# Patient Record
Sex: Male | Born: 1985 | Race: Black or African American | Hispanic: No | Marital: Single | Smoking: Never smoker
Health system: Southern US, Community
[De-identification: ages and names within clinical notes are randomized; demographics above are authoritative.]

---

## 2007-11-29 ENCOUNTER — Emergency Department (HOSPITAL_COMMUNITY): Admission: EM | Admit: 2007-11-29 | Discharge: 2007-11-30 | Payer: Self-pay | Admitting: Emergency Medicine

## 2013-10-16 ENCOUNTER — Other Ambulatory Visit: Payer: Self-pay | Admitting: Thoracic Surgery (Cardiothoracic Vascular Surgery)

## 2013-10-16 ENCOUNTER — Emergency Department (HOSPITAL_COMMUNITY)
Admission: EM | Admit: 2013-10-16 | Discharge: 2013-10-16 | Disposition: A | Discharge status: Home / Self Care | Payer: Self-pay | Attending: Emergency Medicine | Admitting: Emergency Medicine

## 2013-10-16 ENCOUNTER — Encounter (HOSPITAL_COMMUNITY): Payer: Self-pay | Admitting: Emergency Medicine

## 2013-10-16 ENCOUNTER — Emergency Department (HOSPITAL_COMMUNITY): Payer: Self-pay

## 2013-10-16 DIAGNOSIS — S6990XA Unspecified injury of unspecified wrist, hand and finger(s), initial encounter: Secondary | ICD-10-CM

## 2013-10-16 DIAGNOSIS — J9383 Other pneumothorax: Secondary | ICD-10-CM | POA: Insufficient documentation

## 2013-10-16 DIAGNOSIS — J939 Pneumothorax, unspecified: Secondary | ICD-10-CM

## 2013-10-16 DIAGNOSIS — Y9389 Activity, other specified: Secondary | ICD-10-CM | POA: Insufficient documentation

## 2013-10-16 DIAGNOSIS — S62101A Fracture of unspecified carpal bone, right wrist, initial encounter for closed fracture: Secondary | ICD-10-CM

## 2013-10-16 DIAGNOSIS — S62109A Fracture of unspecified carpal bone, unspecified wrist, initial encounter for closed fracture: Secondary | ICD-10-CM | POA: Insufficient documentation

## 2013-10-16 DIAGNOSIS — W108XXA Fall (on) (from) other stairs and steps, initial encounter: Secondary | ICD-10-CM | POA: Insufficient documentation

## 2013-10-16 DIAGNOSIS — S59909A Unspecified injury of unspecified elbow, initial encounter: Secondary | ICD-10-CM | POA: Insufficient documentation

## 2013-10-16 DIAGNOSIS — S2249XA Multiple fractures of ribs, unspecified side, initial encounter for closed fracture: Secondary | ICD-10-CM | POA: Insufficient documentation

## 2013-10-16 DIAGNOSIS — S2241XA Multiple fractures of ribs, right side, initial encounter for closed fracture: Secondary | ICD-10-CM

## 2013-10-16 DIAGNOSIS — W1809XA Striking against other object with subsequent fall, initial encounter: Secondary | ICD-10-CM | POA: Insufficient documentation

## 2013-10-16 DIAGNOSIS — S59919A Unspecified injury of unspecified forearm, initial encounter: Secondary | ICD-10-CM

## 2013-10-16 DIAGNOSIS — Y9289 Other specified places as the place of occurrence of the external cause: Secondary | ICD-10-CM | POA: Insufficient documentation

## 2013-10-16 LAB — BASIC METABOLIC PANEL
Anion gap: 18 — ABNORMAL HIGH (ref 5–15)
BUN: 9 mg/dL (ref 6–23)
CHLORIDE: 98 meq/L (ref 96–112)
CO2: 23 mEq/L (ref 19–32)
CREATININE: 0.74 mg/dL (ref 0.50–1.35)
Calcium: 9.7 mg/dL (ref 8.4–10.5)
GFR calc non Af Amer: >90 mL/min (ref >90)
Glucose, Bld: 97 mg/dL (ref 70–99)
Potassium: 3.7 mEq/L (ref 3.7–5.3)
Sodium: 139 mEq/L (ref 137–147)

## 2013-10-16 LAB — CBC
HCT: 43.6 % (ref 39.0–52.0)
HEMOGLOBIN: 16 g/dL (ref 13.0–17.0)
MCH: 34.9 pg — AB (ref 26.0–34.0)
MCHC: 36.7 g/dL — AB (ref 30.0–36.0)
MCV: 95.2 fL (ref 78.0–100.0)
Platelets: 210 10*3/uL (ref 150–400)
RBC: 4.58 MIL/uL (ref 4.22–5.81)
RDW: 13.9 % (ref 11.5–15.5)
WBC: 13.5 10*3/uL — ABNORMAL HIGH (ref 4.0–10.5)

## 2013-10-16 MED ORDER — KETOROLAC TROMETHAMINE 30 MG/ML IJ SOLN
30.0000 mg | Freq: Once | INTRAMUSCULAR | Status: AC
  Administered 2013-10-16: 30 mg via INTRAVENOUS
  Filled 2013-10-16: qty 1

## 2013-10-16 MED ORDER — NAPROXEN 500 MG PO TABS: 500.0000 mg | ORAL_TABLET | Freq: Two times a day (BID) | ORAL | Status: AC

## 2013-10-16 MED ORDER — MORPHINE SULFATE 4 MG/ML IJ SOLN
4.0000 mg | Freq: Once | INTRAMUSCULAR | Status: AC
  Administered 2013-10-16: 4 mg via INTRAVENOUS
  Filled 2013-10-16: qty 1

## 2013-10-16 MED ORDER — MORPHINE SULFATE 4 MG/ML IJ SOLN: 4.0000 mg | INTRAMUSCULAR | Status: DC | PRN

## 2013-10-16 MED ORDER — HYDROCODONE-ACETAMINOPHEN 5-325 MG PO TABS: 1.0000 | ORAL_TABLET | Freq: Four times a day (QID) | ORAL | Status: DC | PRN

## 2013-10-16 NOTE — ED Notes (Signed)
Pt given specific details on x-ray and cardiothoracic appt tomorrow. Acknowledges understanding of appt details and what symptoms to return to ED for. No other questions/concersns. Resp even/unlabored. Speaking full/clear sentences. SpO2 100% on RA. AVS explained in detail.

## 2013-10-16 NOTE — ED Notes (Addendum)
Pt sts rib pain increases w/ deep breathing and movement.  Bruising noted to R side posterior ribcage.  C/o R wrist pain.  Swelling noted to wrist.  Pt reports take 500mg  Tylenol x 2 w/o relief.
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
## 2013-10-16 NOTE — Discharge Instructions (Signed)
Please seek medical care immediately if you start experiencing a sudden increase in shortness of breath, pain in your chest, fast heartbeat or persistent cough. Do you exert yourself over the next 24 hours, do not smoke. Use your incentive spirometer and take your naproxen pills in order to optimize your breathing. If pain gets worse, you may also use the narco  Then, make sure to attend your xray and cardiothoracic surgery appointment tomorrow.   Pneumothorax A pneumothorax, commonly called a collapsed lung, is a condition in which air leaks from a lung and builds up in the space between the lung and the chest wall (pleural space). The air in a pneumothorax is trapped outside the lung and takes up space, preventing the lung from fully expanding. This is a condition that usually occurs suddenly. The buildup of air may be small or large. A small pneumothorax may go away on its own. When a pneumothorax is larger, it will often require medical treatment and hospitalization.  CAUSES  A pneumothorax can sometimes happen quickly with no apparent cause. People with underlying lung problems, particularly COPD or emphysema, are at higher risk of pneumothorax. However, pneumothorax can happen quickly even in people with no prior known lung problems. Trauma, surgery, medical procedures, or injury to the chest wall can also cause a pneumothorax. SIGNS AND SYMPTOMS  Sometimes a pneumothorax will have no symptoms. When symptoms are present, they can include:  Chest pain.  Shortness of breath.  Increased rate of breathing.  Bluish color to your lips or skin (cyanosis). DIAGNOSIS  Pneumothorax is usually diagnosed by a chest X-ray or chest CT scan. Your health care provider will also take a medical history and perform a physical exam to determine why you may have a pneumothorax. TREATMENT  A small pneumothorax may go away on its own without treatment. Extra oxygen can sometimes help a small pneumothorax go  away more quickly. For a larger pneumothorax or a pneumothorax that is causing symptoms, a procedure is usually needed to drain the air.In some cases, the health care provider may drain the air using a needle. In other cases, a chest tube may be inserted into the pleural space. A chest tube is a small tube placed between the ribs and into the pleural space. This removes the extra air and allows the lung to expand back to its normal size. A large pneumothorax will usually require a hospital stay. If there is ongoing air leakage into the pleural space, then the chest tube may need to remain in place for several days until the air leak has healed. In some cases, surgery may be needed.  HOME CARE INSTRUCTIONS   Only take over-the-counter or prescription medicines as directed by your health care provider.  If a cough or pain makes it difficult for you to sleep at night, try sleeping in a semi-upright position in a recliner or by using 2 or 3 pillows.  Rest and limit activity as directed by your health care provider.  If you had a chest tube and it was removed, ask your health care provider when it is okay to remove the dressing. Until your health care provider says you can remove the dressing, do not allow it to get wet.  Do not smoke. Smoking is a risk factor for pneumothorax.  Do not fly in an airplane or scuba dive until your health care provider says it is okay.  Follow up with your health care provider as directed. SEEK IMMEDIATE MEDICAL CARE IF:  You have increasing chest pain or shortness of breath.  You have a cough that is not controlled with suppressants.  You begin coughing up blood.  You have pain that is getting worse or is not controlled with medicines.  You cough up thick, discolored mucus (sputum) that is yellow to green in color.  You have redness, increasing pain, or discharge at the site where a chest tube had been in place (if your pneumothorax was treated with a chest  tube).  The site where your chest tube was located opens up.  You feel air coming out of the site where the chest tube was placed.  You have a fever or persistent symptoms for more than 2-3 days.  You have a fever and your symptoms suddenly get worse. MAKE SURE YOU:   Understand these instructions.  Will watch your condition.  Will get help right away if you are not doing well or get worse. Document Released: 03/02/2005 Document Revised: 12/21/2012 Document Reviewed: 09/29/2012 [facility] Patient Information 2015 [place], [place]. This information is not intended to replace advice given to you by your health care provider. Make sure you discuss any questions you have with your health care provider.   Rib Fracture A rib fracture is a break or crack in one of the bones of the ribs. The ribs are a group of long, curved bones that wrap around your chest and attach to your spine. They protect your lungs and other organs in the chest cavity. A broken or cracked rib is often painful, but most do not cause other problems. Most rib fractures heal on their own over time. However, rib fractures can be more serious if multiple ribs are broken or if broken ribs move out of place and push against other structures. CAUSES   A direct blow to the chest. For example, this could happen during contact sports, a car accident, or a fall against a hard object.  Repetitive movements with high force, such as pitching a baseball or having severe coughing spells. SYMPTOMS   Pain when you breathe in or cough.  Pain when someone presses on the injured area. DIAGNOSIS  Your caregiver will perform a physical exam. Various imaging tests may be ordered to confirm the diagnosis and to look for related injuries. These tests may include a chest X-ray, computed tomography (CT), magnetic resonance imaging (MRI), or a bone scan. TREATMENT  Rib fractures usually heal on their own in 1-3 months. The longer healing period is  often associated with a continued cough or other aggravating activities. During the healing period, pain control is very important. Medication is usually given to control pain. Hospitalization or surgery may be needed for more severe injuries, such as those in which multiple ribs are broken or the ribs have moved out of place.  HOME CARE INSTRUCTIONS   Avoid strenuous activity and any activities or movements that cause pain. Be careful during activities and avoid bumping the injured rib.  Gradually increase activity as directed by your caregiver.  Only take over-the-counter or prescription medications as directed by your caregiver. Do not take other medications without asking your caregiver first.  Apply ice to the injured area for the first 1-2 days after you have been treated or as directed by your caregiver. Applying ice helps to reduce inflammation and pain.  Put ice in a plastic bag.  Place a towel between your skin and the bag.   Leave the ice on for 15-20 minutes at a time, every  2 hours while you are awake.  Perform deep breathing as directed by your caregiver. This will help prevent pneumonia, which is a common complication of a broken rib. Your caregiver may instruct you to:  Take deep breaths several times a day.  Try to cough several times a day, holding a pillow against the injured area.  Use a device called an incentive spirometer to practice deep breathing several times a day.  Drink enough fluids to keep your urine clear or pale yellow. This will help you avoid constipation.   Do not wear a rib belt or binder. These restrict breathing, which can lead to pneumonia.  SEEK IMMEDIATE MEDICAL CARE IF:   You have a fever.   You have difficulty breathing or shortness of breath.   You develop a continual cough, or you cough up thick or bloody sputum.  You feel sick to your stomach (nausea), throw up (vomit), or have abdominal pain.   You have worsening pain not  controlled with medications.  MAKE SURE YOU:  Understand these instructions.  Will watch your condition.  Will get help right away if you are not doing well or get worse. Document Released: 03/02/2005 Document Revised: 11/02/2012 Document Reviewed: 05/04/2012 [facility] Patient Information 2015 [place], [place]. This information is not intended to replace advice given to you by your health care provider. Make sure you discuss any questions you have with your health care provider.    Wrist Fracture A wrist fracture is a break or crack in one of the bones of your wrist. Your wrist is made up of eight small bones at the palm of your hand (carpal bones) and two long bones that make up your forearm (radius and ulna).  CAUSES   A direct blow to the wrist.  Falling on an outstretched hand.  Trauma, such as a car accident or a fall. RISK FACTORS Risk factors for wrist fracture include:   Participating in contact and high-risk sports, such as skiing, biking, and ice skating.  Taking steroid medicines.  Smoking.  Being male.  Being Caucasian.  Drinking more than three alcoholic beverages per day.  Having low or lowered bone density (osteoporosis or osteopenia).  Age. Older adults have decreased bone density.  Women who have had menopause.  History of previous fractures. SIGNS AND SYMPTOMS Symptoms of wrist fractures include tenderness, bruising, and inflammation. Additionally, the wrist may hang in an odd position or appear deformed.  DIAGNOSIS Diagnosis may include:  Physical exam.  X-ray. TREATMENT Treatment depends on many factors, including the nature and location of the fracture, your age, and your activity level. Treatment for wrist fracture can be nonsurgical or surgical.  Nonsurgical Treatment A plaster cast or splint may be applied to your wrist if the bone is in a good position. If the fracture is not in good position, it may be necessary for your health care  provider to realign it before applying a splint or cast. Usually, a cast or splint will be worn for several weeks.  Surgical Treatment Sometimes the position of the bone is so far out of place that surgery is required to apply a device to hold it together as it heals. Depending on the fracture, there are a number of options for holding the bone in place while it heals, such as a cast and metal pins.  HOME CARE INSTRUCTIONS  Keep your injured wrist elevated and move your fingers as much as possible.  Do not put pressure on any part of  your cast or splint. It may break.   Use a plastic bag to protect your cast or splint from water while bathing or showering. Do not lower your cast or splint into water.  Take medicines only as directed by your health care provider.  Keep your cast or splint clean and dry. If it becomes wet, damaged, or suddenly feels too tight, contact your health care provider right away.  Do not use any tobacco products including cigarettes, chewing tobacco, or electronic cigarettes. Tobacco can delay bone healing. If you need help quitting, ask your health care provider.  Keep all follow-up visits as directed by your health care provider. This is important.  Ask your health care provider if you should take supplements of calcium and vitamins C and D to promote bone healing. SEEK MEDICAL CARE IF:   Your cast or splint is damaged, breaks, or gets wet.  You have a fever.  You have chills.  You have continued severe pain or more swelling than you did before the cast was put on. SEEK IMMEDIATE MEDICAL CARE IF:   Your hand or fingernails on the injured arm turn blue or gray, or feel cold or numb.  You have decreased feeling in the fingers of your injured arm. MAKE SURE YOU:  Understand these instructions.  Will watch your condition.  Will get help right away if you are not doing well or get worse. Document Released: 12/10/2004 Document Revised: 07/17/2013  Document Reviewed: 03/20/2011 [facility] Patient Information 2015 [place], [place]. This information is not intended to replace advice given to you by your health care provider. Make sure you discuss any questions you have with your health care provider.

## 2013-10-16 NOTE — ED Notes (Signed)
Pt made aware of PRN pain medication.  Pt is refusing at this time.

## 2013-10-16 NOTE — ED Notes (Signed)
MD at bedside. 

## 2013-10-17 ENCOUNTER — Other Ambulatory Visit: Payer: Self-pay | Admitting: *Deleted

## 2013-10-17 ENCOUNTER — Encounter: Payer: Self-pay | Admitting: Thoracic Surgery (Cardiothoracic Vascular Surgery)

## 2013-10-17 DIAGNOSIS — J9383 Other pneumothorax: Secondary | ICD-10-CM

## 2013-10-17 NOTE — ED Provider Notes (Signed)
Medical screening examination/treatment/procedure(s) were conducted as a shared visit with resident-physician practitioner(s) and myself.  I personally evaluated the patient during the encounter.  Pt is a 28 y.o. male with pmhx as above presenting with R lateral chest wall pain and R wrist pain after mechanical fall down steps yesterday.  Pt found to have rib fx of R 9, 10, 11 as well as R sided ptx w/o signs of tension. He has pain w/ inspiration, but no overt resp distress, SOB or O2 requirement. He has declined narcotics. R wrist w/ nondisplaced distal radius fx. Will place in radial gutter splint. Spoke w/ CT surgery RE PTX. Giving timing of injury and reassuring PE, D.r Tyrone SageGerhardt has offered to f/u with the pt in the office tomorrow for repeat exam and repeat CXR. Pt is in agreement of plan and also agrees to return for new or worsening symptoms.  Incentive spirometer given.   1. Pneumothorax on right   2. Wrist fracture, right, closed, initial encounter   3. Ribs, multiple fractures, right, closed, initial encounter       EKG Interpretation  Date/Time:    Ventricular Rate:    PR Interval:    QRS Duration:   QT Interval:    QTC Calculation:   R Axis:     Text Interpretation:          Shanna CiscoMegan E Glenn Christo, MD 10/17/13 1344

## 2014-04-18 ENCOUNTER — Encounter (HOSPITAL_COMMUNITY): Payer: Self-pay | Admitting: Emergency Medicine

## 2014-04-18 ENCOUNTER — Emergency Department (HOSPITAL_COMMUNITY)
Admission: EM | Admit: 2014-04-18 | Discharge: 2014-04-18 | Disposition: A | Discharge status: Home / Self Care | Payer: Worker's Compensation | Attending: Emergency Medicine | Admitting: Emergency Medicine

## 2014-04-18 ENCOUNTER — Emergency Department (HOSPITAL_COMMUNITY): Payer: Worker's Compensation

## 2014-04-18 DIAGNOSIS — S61307A Unspecified open wound of left little finger with damage to nail, initial encounter: Secondary | ICD-10-CM | POA: Diagnosis not present

## 2014-04-18 DIAGNOSIS — S61309A Unspecified open wound of unspecified finger with damage to nail, initial encounter: Secondary | ICD-10-CM

## 2014-04-18 DIAGNOSIS — S6992XA Unspecified injury of left wrist, hand and finger(s), initial encounter: Secondary | ICD-10-CM | POA: Diagnosis present

## 2014-04-18 DIAGNOSIS — Y990 Civilian activity done for income or pay: Secondary | ICD-10-CM | POA: Insufficient documentation

## 2014-04-18 DIAGNOSIS — Y9389 Activity, other specified: Secondary | ICD-10-CM | POA: Diagnosis not present

## 2014-04-18 DIAGNOSIS — S62632B Displaced fracture of distal phalanx of right middle finger, initial encounter for open fracture: Secondary | ICD-10-CM | POA: Diagnosis not present

## 2014-04-18 DIAGNOSIS — W228XXA Striking against or struck by other objects, initial encounter: Secondary | ICD-10-CM | POA: Insufficient documentation

## 2014-04-18 DIAGNOSIS — Y9289 Other specified places as the place of occurrence of the external cause: Secondary | ICD-10-CM | POA: Insufficient documentation

## 2014-04-18 DIAGNOSIS — S62609B Fracture of unspecified phalanx of unspecified finger, initial encounter for open fracture: Secondary | ICD-10-CM

## 2014-04-18 DIAGNOSIS — Z791 Long term (current) use of non-steroidal anti-inflammatories (NSAID): Secondary | ICD-10-CM | POA: Diagnosis not present

## 2014-04-18 MED ORDER — CEPHALEXIN 500 MG PO CAPS: 500.0000 mg | ORAL_CAPSULE | Freq: Four times a day (QID) | ORAL | Status: AC

## 2014-04-18 MED ORDER — HYDROCODONE-ACETAMINOPHEN 5-325 MG PO TABS
2.0000 | ORAL_TABLET | Freq: Once | ORAL | Status: AC
  Administered 2014-04-18: 2 via ORAL
  Filled 2014-04-18: qty 2

## 2014-04-18 MED ORDER — CEPHALEXIN 500 MG PO CAPS
500.0000 mg | ORAL_CAPSULE | Freq: Once | ORAL | Status: AC
  Administered 2014-04-18: 500 mg via ORAL
  Filled 2014-04-18: qty 1

## 2014-04-18 MED ORDER — LIDOCAINE HCL 2 % IJ SOLN
10.0000 mL | Freq: Once | INTRAMUSCULAR | Status: AC
  Administered 2014-04-18: 200 mg
  Filled 2014-04-18: qty 20

## 2014-04-18 MED ORDER — HYDROCODONE-ACETAMINOPHEN 5-325 MG PO TABS: 1.0000 | ORAL_TABLET | ORAL | Status: AC | PRN

## 2014-04-18 NOTE — Discharge Instructions (Signed)
Recheck with Hydrographic surveyorhand surgeon. We have given you the name of our on call hand surgeon. When you see your occupational health Dr. Bea Lauraomorrow they may choose to re-evaluate your finger, or refer you to a hand surgeon of their choice. Keep dressing on until re-evaluated (no more than 72 hours).  Finger Fracture Fractures of fingers are breaks in the bones of the fingers. There are many types of fractures. There are different ways of treating these fractures. Your health care provider will discuss the best way to treat your fracture. CAUSES Traumatic injury is the main cause of broken fingers. These include:  Injuries while playing sports.  Workplace injuries.  Falls. RISK FACTORS Activities that can increase your risk of finger fractures include:  Sports.  Workplace activities that involve machinery.  A condition called osteoporosis, which can make your bones less dense and cause them to fracture more easily. SIGNS AND SYMPTOMS The main symptoms of a broken finger are pain and swelling within 15 minutes after the injury. Other symptoms include:  Bruising of your finger.  Stiffness of your finger.  Numbness of your finger.  Exposed bones (compound fracture) if the fracture is severe. DIAGNOSIS  The best way to diagnose a broken bone is with X-ray imaging. Additionally, your health care provider will use this X-ray image to evaluate the position of the broken finger bones.  TREATMENT  Finger fractures can be treated with:   Nonreduction--This means the bones are in place. The finger is splinted without changing the positions of the bone pieces. The splint is usually left on for about a week to 10 days. This will depend on your fracture and what your health care provider thinks.  Closed reduction--The bones are put back into position without using surgery. The finger is then splinted.  Open reduction and internal fixation--The fracture site is opened. Then the bone pieces are fixed into  place with pins or some type of hardware. This is seldom required. It depends on the severity of the fracture. HOME CARE INSTRUCTIONS   Follow your health care provider's instructions regarding activities, exercises, and physical therapy.  Only take over-the-counter or prescription medicines for pain, discomfort, or fever as directed by your health care provider. SEEK MEDICAL CARE IF: You have pain or swelling that limits the motion or use of your fingers. SEEK IMMEDIATE MEDICAL CARE IF:  Your finger becomes numb. MAKE SURE YOU:   Understand these instructions.  Will watch your condition.  Will get help right away if you are not doing well or get worse. Document Released: 06/14/2000 Document Revised: 12/21/2012 Document Reviewed: 10/12/2012 [facility] Patient Information 2015 [place], [place]. This information is not intended to replace advice given to you by your health care provider. Make sure you discuss any questions you have with your health care provider.

## 2014-04-18 NOTE — ED Notes (Signed)
Pt states he smashed his little finger on his left hand at work tonight  Bleeding controlled

## 2014-04-18 NOTE — ED Provider Notes (Addendum)
CSN: [id number]     Arrival date & time 04/18/14  0206 History   First MD Initiated Contact with Patient 04/18/14 0222     Chief Complaint  Patient presents with  . Finger Injury      HPI  Patient presents with evaluation of the left small finger injury. He was at work [employer]. He was not wearing gloves. He was holding a large metal pole. He struck the pole against a stationary part of a heavy metal cart. Had a crush injury with injury to his distal phalanx of his left small finger with nail injury.  History reviewed. No pertinent past medical history. History reviewed. No pertinent past surgical history. History reviewed. No pertinent family history. History  Substance Use Topics  . Smoking status: Never Smoker   . Smokeless tobacco: Not on file  . Alcohol Use: Yes     Comment: occ    Review of Systems  Musculoskeletal:       Isolated left finger injury. No additional areas of pain or concern.      Allergies  Review of patient's allergies indicates no known allergies.  Home Medications   Prior to Admission medications   Medication Sig Start Date End Date Taking? Authorizing Provider  acetaminophen (TYLENOL) 500 MG tablet Take 1,000 mg by mouth every 6 (six) hours as needed for mild pain or headache.    Historical Provider, MD  cephALEXin (KEFLEX) 500 MG capsule Take 1 capsule (500 mg total) by mouth 4 (four) times daily. 04/18/14   Rolland PorterMark Niala Stcharles, MD  HYDROcodone-acetaminophen (NORCO/VICODIN) 5-325 MG per tablet Take 1 tablet by mouth every 4 (four) hours as needed. 04/18/14   Rolland PorterMark Coree Riester, MD  naproxen (NAPROSYN) 500 MG tablet Take 1 tablet (500 mg total) by mouth 2 (two) times daily with a meal. 10/16/13   Dionne AnoJulia Mallory, MD   BP 122/77 mmHg  Pulse 82  Temp(Src) 98.4 F (36.9 C) (Oral)  Resp 16  SpO2 97% Physical Exam  Musculoskeletal:       Hands:   ED Course  Procedures (including critical care time) Labs Review Labs Reviewed - No data to display  Imaging Review Dg  Finger Little Left  04/18/2014   CLINICAL DATA:  Left fifth digit pain after injury. Smashed finger between two objects.  EXAM: LEFT LITTLE FINGER 2+V  COMPARISON:  None.  FINDINGS: There is a mildly displaced fracture of the little finger distal tuft with dorsal displacement distal fracture right. Associated soft tissue irregularity is seen. There are no radiopaque foreign bodies. No additional fracture.  IMPRESSION: Mildly displaced distal tuft fracture of the little finger. Associated soft tissue irregularity is seen.   Electronically Signed   By: Rubye OaksMelanie  Ehinger M.D.   On: 04/18/2014 02:50     EKG Interpretation None       LACERATION REPAIR Performed by: Claudean KindsJAMES, Anetta Olvera JOSEPH Authorized by: Claudean KindsJAMES, Jahvon Gosline JOSEPH Consent: Verbal consent obtained. Risks and benefits: risks, benefits and alternatives were discussed Consent given by: patient Patient identity confirmed: provided demographic data Prepped and Draped in normal sterile fashion Wound explored  Laceration Location: Right 5th digit P3 and Nail bed  Laceration Length: 2cm  No Foreign Bodies seen or palpated  Anesthesia: local infiltration  Local anesthetic: lidocaine 2% without epinephrine digital block  Anesthetic total: 4 ml  Irrigation method: syringe Amount of cleaning: standard  Nail was incompletely avulsed. Proximal one third of the radial lateral elbow still had to the nailbed. This was removed. The nailbed was  then repaired. 6 sutures placed of the nailbed, 5-0 Vicryl.  Skin closure: 5-0 Prolene  Number of sutures: 4  Technique: simple  Patient tolerance: Patient tolerated the procedure well with no immediate complications.  MDM   Final diagnoses:  Fingernail avulsion, initial encounter  Open fracture of finger, initial encounter    Digit was repaired. There is a longitudinal laceration to the nailbed. The nail is removed nailbed was repaired with 5-0 Vicryl. Adjacent tissues were repaired with 5-0  proline. Nail was then sutured back into the eponychial fold and overlying the no laceration. Wound was then addressed. Patient's supervisor from work is here with him. He states that [employer] has a occupational health clinic tomorrow. He'll be reevaluated there. Given information regarding our hand surgeon on call. I would like for this injury to be  looked at again between 48- 72 hours. Ultimate f/u with a hand surgeon in 7-10 days.    Rolland Porter, MD 04/18/14 1191  Rolland Porter, MD 04/18/14 [phone]

## 2016-07-28 IMAGING — CR DG FINGER LITTLE 2+V*L*
3 series · 3 of 3 positions shown · non-contrast
Comparison: None.

CLINICAL DATA: Left fifth digit pain after injury. Smashed finger
between two objects.

EXAM:
LEFT LITTLE FINGER 2+V

[x finger pa left]
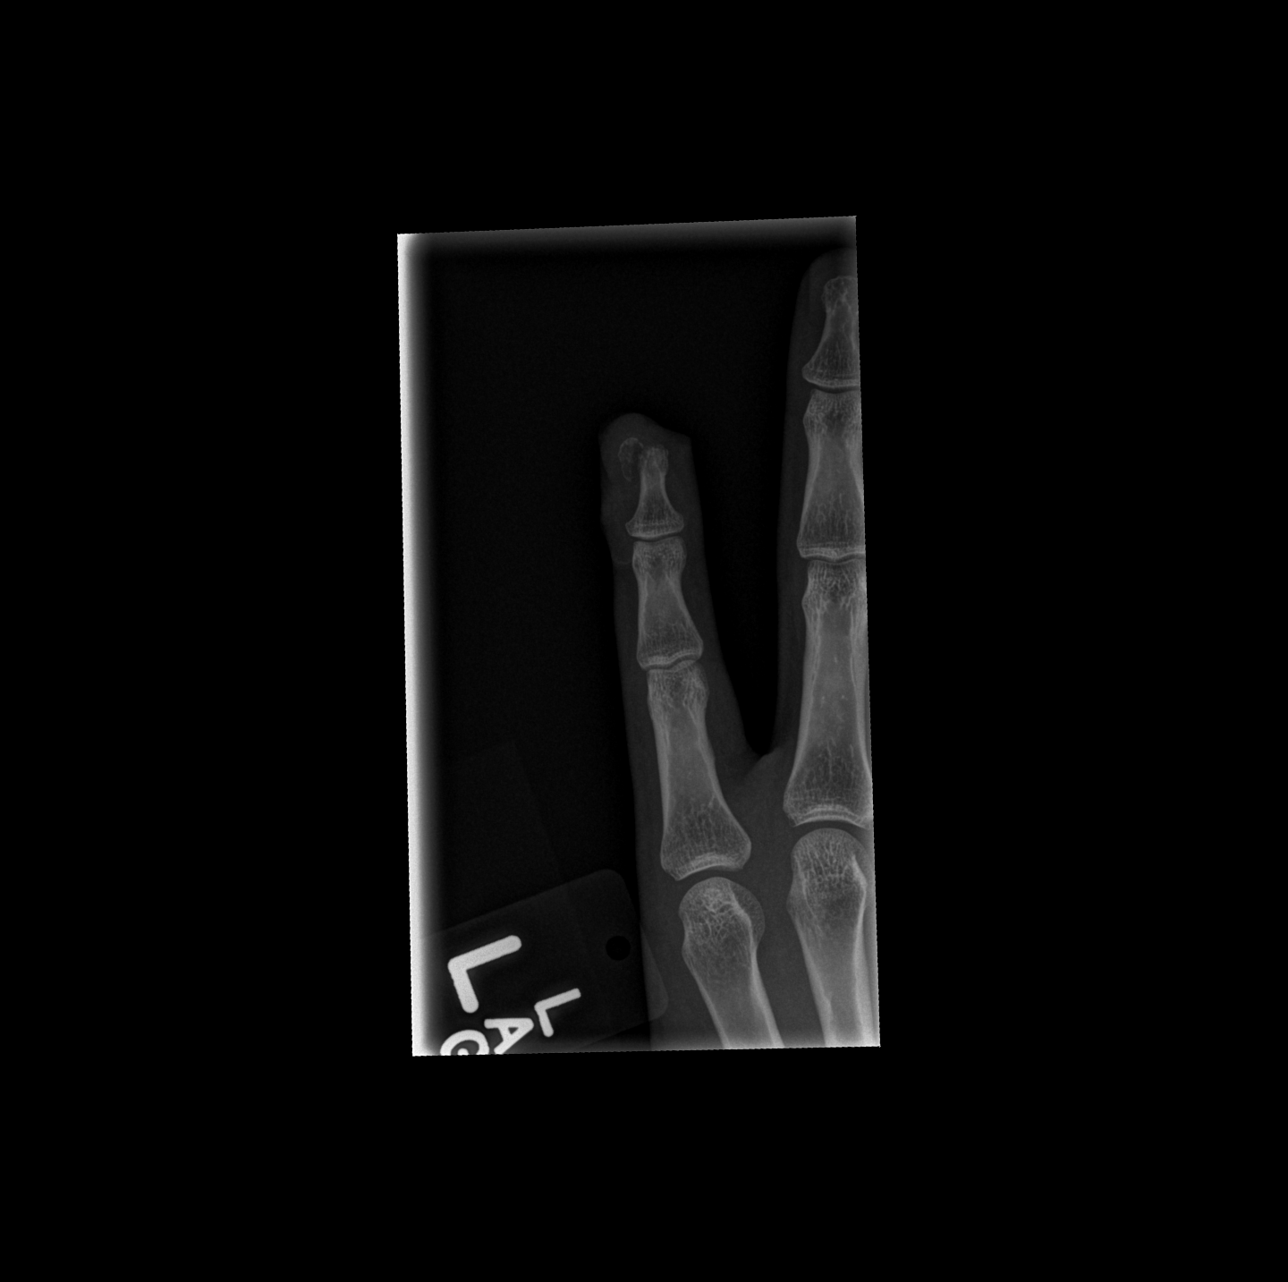

[x finger obl left]
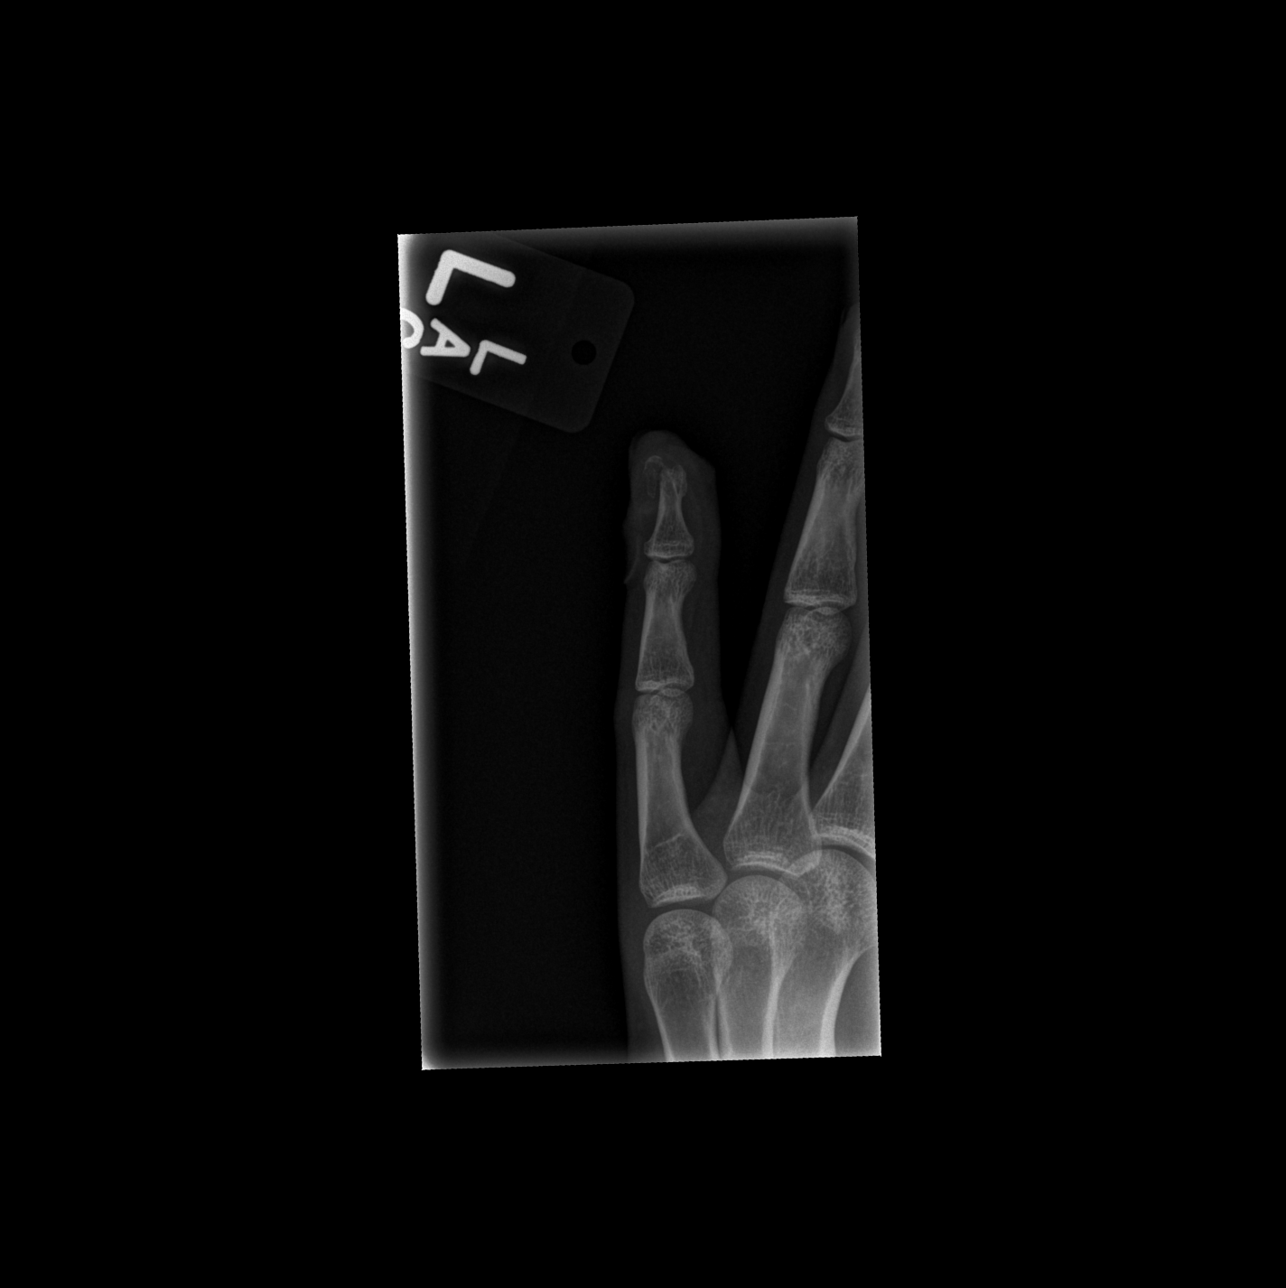

[x finger lat left]
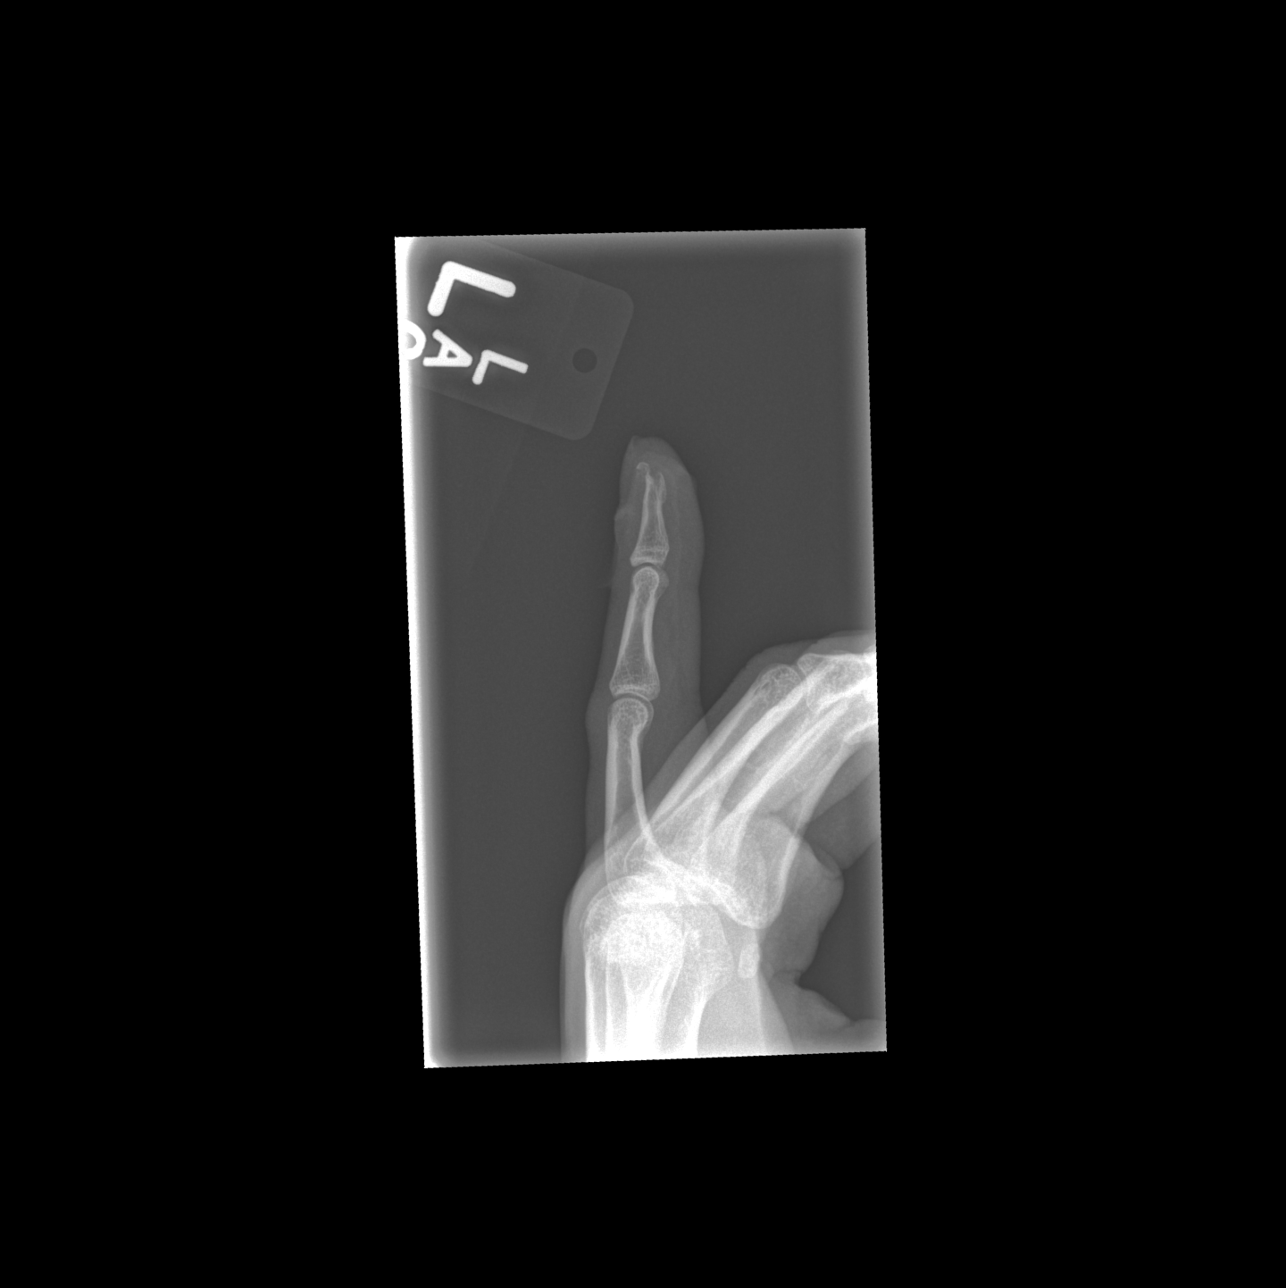

[3 of 3 positions shown; findings below may reference images not displayed]

FINDINGS: There is a mildly displaced fracture of the little finger distal
tuft with dorsal displacement distal fracture right. Associated soft
tissue irregularity is seen. There are no radiopaque foreign bodies.
No additional fracture.
IMPRESSION: Mildly displaced distal tuft fracture of the little finger.
Associated soft tissue irregularity is seen.
